# Patient Record
Sex: Male | Born: 1970 | Race: White | Hispanic: No | Marital: Single | Smoking: Current every day smoker
Health system: Southern US, Community
[De-identification: ages and names within clinical notes are randomized; demographics above are authoritative.]

---

## 2015-03-10 ENCOUNTER — Emergency Department (HOSPITAL_COMMUNITY): Payer: Self-pay

## 2015-03-10 ENCOUNTER — Emergency Department (HOSPITAL_COMMUNITY)
Admission: EM | Admit: 2015-03-10 | Discharge: 2015-03-11 | Disposition: A | Discharge status: Other Acute Inpatient Hospital | Payer: Self-pay | Attending: Emergency Medicine | Admitting: Emergency Medicine

## 2015-03-10 ENCOUNTER — Encounter (HOSPITAL_COMMUNITY): Payer: Self-pay

## 2015-03-10 DIAGNOSIS — S32492A Other specified fracture of left acetabulum, initial encounter for closed fracture: Secondary | ICD-10-CM | POA: Insufficient documentation

## 2015-03-10 DIAGNOSIS — S80212A Abrasion, left knee, initial encounter: Secondary | ICD-10-CM | POA: Insufficient documentation

## 2015-03-10 DIAGNOSIS — S60511A Abrasion of right hand, initial encounter: Secondary | ICD-10-CM | POA: Insufficient documentation

## 2015-03-10 DIAGNOSIS — S50311A Abrasion of right elbow, initial encounter: Secondary | ICD-10-CM | POA: Insufficient documentation

## 2015-03-10 DIAGNOSIS — S0033XA Contusion of nose, initial encounter: Secondary | ICD-10-CM | POA: Insufficient documentation

## 2015-03-10 DIAGNOSIS — S32402A Unspecified fracture of left acetabulum, initial encounter for closed fracture: Secondary | ICD-10-CM

## 2015-03-10 DIAGNOSIS — G8929 Other chronic pain: Secondary | ICD-10-CM | POA: Insufficient documentation

## 2015-03-10 DIAGNOSIS — S36039A Unspecified laceration of spleen, initial encounter: Secondary | ICD-10-CM | POA: Insufficient documentation

## 2015-03-10 DIAGNOSIS — S72002A Fracture of unspecified part of neck of left femur, initial encounter for closed fracture: Secondary | ICD-10-CM | POA: Insufficient documentation

## 2015-03-10 DIAGNOSIS — S0993XA Unspecified injury of face, initial encounter: Secondary | ICD-10-CM | POA: Insufficient documentation

## 2015-03-10 DIAGNOSIS — S72009A Fracture of unspecified part of neck of unspecified femur, initial encounter for closed fracture: Secondary | ICD-10-CM

## 2015-03-10 DIAGNOSIS — Y9389 Activity, other specified: Secondary | ICD-10-CM | POA: Insufficient documentation

## 2015-03-10 DIAGNOSIS — Y998 Other external cause status: Secondary | ICD-10-CM | POA: Insufficient documentation

## 2015-03-10 DIAGNOSIS — Y92410 Unspecified street and highway as the place of occurrence of the external cause: Secondary | ICD-10-CM | POA: Insufficient documentation

## 2015-03-10 DIAGNOSIS — F17210 Nicotine dependence, cigarettes, uncomplicated: Secondary | ICD-10-CM | POA: Insufficient documentation

## 2015-03-10 DIAGNOSIS — S0001XA Abrasion of scalp, initial encounter: Secondary | ICD-10-CM | POA: Insufficient documentation

## 2015-03-10 DIAGNOSIS — S80211A Abrasion, right knee, initial encounter: Secondary | ICD-10-CM | POA: Insufficient documentation

## 2015-03-10 DIAGNOSIS — S0031XA Abrasion of nose, initial encounter: Secondary | ICD-10-CM | POA: Insufficient documentation

## 2015-03-10 DIAGNOSIS — S0081XA Abrasion of other part of head, initial encounter: Secondary | ICD-10-CM | POA: Insufficient documentation

## 2015-03-10 MED ORDER — TETANUS-DIPHTH-ACELL PERTUSSIS 5-2.5-18.5 LF-MCG/0.5 IM SUSP
0.5000 mL | Freq: Once | INTRAMUSCULAR | Status: AC
  Administered 2015-03-11: 0.5 mL via INTRAMUSCULAR
  Filled 2015-03-10: qty 0.5

## 2015-03-10 MED ORDER — HYDROGEN PEROXIDE 3 % EX SOLN
CUTANEOUS | Status: AC
  Administered 2015-03-10
  Filled 2015-03-10: qty 473

## 2015-03-10 NOTE — ED Notes (Signed)
Wound care complete

## 2015-03-10 NOTE — ED Notes (Signed)
c-collar placed upon arrival to ED. EDP at bedside at this time Patient placed in patient gown, physical exam at this time.
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
## 2015-03-11 ENCOUNTER — Other Ambulatory Visit (HOSPITAL_COMMUNITY): Payer: Self-pay

## 2015-03-11 LAB — CBC WITH DIFFERENTIAL/PLATELET
Basophils Absolute: 0 10*3/uL (ref 0.0–0.1)
Basophils Relative: 0 %
EOS ABS: 0 10*3/uL (ref 0.0–0.7)
EOS PCT: 0 %
HCT: 38.4 % — ABNORMAL LOW (ref 39.0–52.0)
Hemoglobin: 13.3 g/dL (ref 13.0–17.0)
LYMPHS ABS: 1.9 10*3/uL (ref 0.7–4.0)
LYMPHS PCT: 8 %
MCH: 33.9 pg (ref 26.0–34.0)
MCHC: 34.6 g/dL (ref 30.0–36.0)
MCV: 98 fL (ref 78.0–100.0)
MONOS PCT: 5 %
Monocytes Absolute: 1.1 10*3/uL — ABNORMAL HIGH (ref 0.1–1.0)
Neutro Abs: 19.7 10*3/uL — ABNORMAL HIGH (ref 1.7–7.7)
Neutrophils Relative %: 87 %
PLATELETS: 214 10*3/uL (ref 150–400)
RBC: 3.92 MIL/uL — ABNORMAL LOW (ref 4.22–5.81)
RDW: 12.2 % (ref 11.5–15.5)
WBC: 22.7 10*3/uL — ABNORMAL HIGH (ref 4.0–10.5)

## 2015-03-11 LAB — COMPREHENSIVE METABOLIC PANEL
ALK PHOS: 62 U/L (ref 38–126)
ALT: 34 U/L (ref 17–63)
ANION GAP: 9 (ref 5–15)
AST: 70 U/L — ABNORMAL HIGH (ref 15–41)
Albumin: 3.5 g/dL (ref 3.5–5.0)
BUN: 15 mg/dL (ref 6–20)
CHLORIDE: 108 mmol/L (ref 101–111)
CO2: 19 mmol/L — AB (ref 22–32)
CREATININE: 0.87 mg/dL (ref 0.61–1.24)
Calcium: 8.4 mg/dL — ABNORMAL LOW (ref 8.9–10.3)
Glucose, Bld: 152 mg/dL — ABNORMAL HIGH (ref 65–99)
Potassium: 3.6 mmol/L (ref 3.5–5.1)
SODIUM: 136 mmol/L (ref 135–145)
Total Bilirubin: 0.5 mg/dL (ref 0.3–1.2)
Total Protein: 6.3 g/dL — ABNORMAL LOW (ref 6.5–8.1)

## 2015-03-11 LAB — PROTIME-INR
INR: 1.13 (ref 0.00–1.49)
Prothrombin Time: 14.7 seconds (ref 11.6–15.2)

## 2015-03-11 LAB — TYPE AND SCREEN
ABO/RH(D): O POS
Antibody Screen: NEGATIVE
UNIT DIVISION: 0

## 2015-03-11 LAB — I-STAT CHEM 8, ED
BUN: 13 mg/dL (ref 6–20)
CALCIUM ION: 1.19 mmol/L (ref 1.12–1.23)
CHLORIDE: 102 mmol/L (ref 101–111)
CREATININE: 1.1 mg/dL (ref 0.61–1.24)
GLUCOSE: 132 mg/dL — AB (ref 65–99)
HCT: 42 % (ref 39.0–52.0)
Hemoglobin: 14.3 g/dL (ref 13.0–17.0)
POTASSIUM: 3.4 mmol/L — AB (ref 3.5–5.1)
Sodium: 138 mmol/L (ref 135–145)
TCO2: 20 mmol/L (ref 0–100)

## 2015-03-11 LAB — ABO/RH: ABO/RH(D): O POS

## 2015-03-11 LAB — PREPARE RBC (CROSSMATCH)

## 2015-03-11 MED ORDER — SODIUM CHLORIDE 0.9 % IV BOLUS (SEPSIS)
1000.0000 mL | Freq: Once | INTRAVENOUS | Status: AC
  Administered 2015-03-11: 1000 mL via INTRAVENOUS

## 2015-03-11 MED ORDER — FENTANYL CITRATE (PF) 100 MCG/2ML IJ SOLN
50.0000 ug | Freq: Once | INTRAMUSCULAR | Status: AC
  Administered 2015-03-11: 50 ug via INTRAVENOUS
  Filled 2015-03-11: qty 2

## 2015-03-11 MED ORDER — IOHEXOL 300 MG/ML  SOLN
100.0000 mL | Freq: Once | INTRAMUSCULAR | Status: AC | PRN
  Administered 2015-03-11: 100 mL via INTRAVENOUS

## 2015-03-11 NOTE — ED Notes (Signed)
[facility] by [facility]. Patient stable enough for transport at this time.

## 2015-03-11 NOTE — ED Notes (Signed)
[facility], StatisticianCrystal RN

## 2015-03-11 NOTE — ED Notes (Signed)
Blood products sent with [facility] for [facility] transport [facility]

## 2015-03-11 NOTE — ED Notes (Signed)
Report given to [facility], Chris RN
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
